# Patient Record
Sex: Female | Born: 1980 | Race: White | Hispanic: No | Smoking: Current every day smoker
Health system: Southern US, Community
[De-identification: ages and names within clinical notes are randomized; demographics above are authoritative.]

## PROBLEM LIST (undated history)

## (undated) DIAGNOSIS — R51 Headache: Secondary | ICD-10-CM

## (undated) DIAGNOSIS — R519 Headache, unspecified: Secondary | ICD-10-CM

## (undated) HISTORY — PX: NO PAST SURGERIES: SHX2092

---

## 2008-01-16 ENCOUNTER — Emergency Department (HOSPITAL_COMMUNITY): Admission: EM | Admit: 2008-01-16 | Discharge: 2008-01-16 | Payer: Self-pay | Admitting: Emergency Medicine

## 2010-01-04 IMAGING — CR DG ANKLE COMPLETE 3+V*L*
3 series · 3 of 3 positions shown · non-contrast
Comparison: None

CLINICAL DATA: Fell.  Pain.

LEFT ANKLE COMPLETE - 3+ VIEW

[t ankle joint ap left]
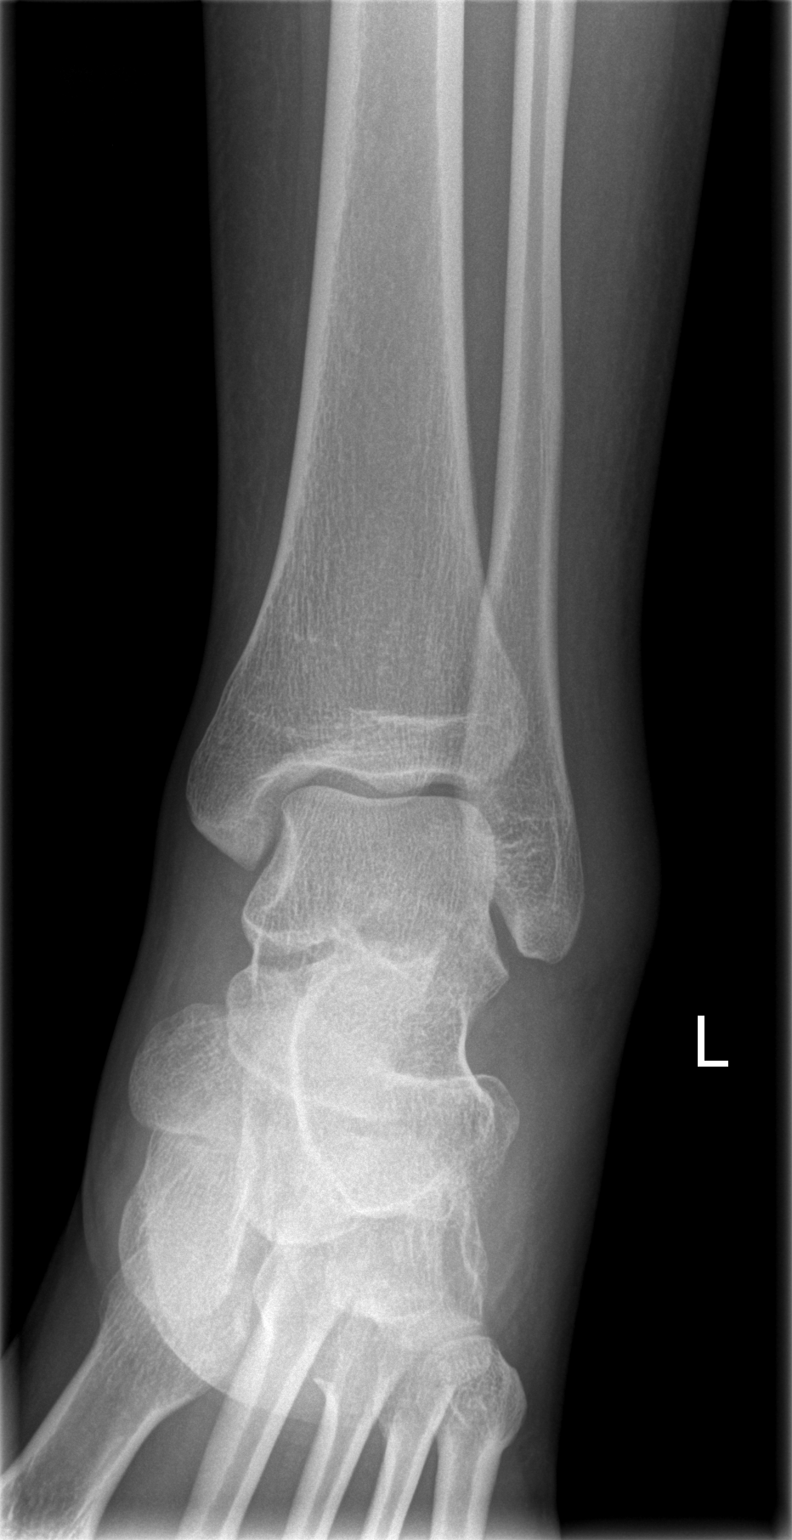

[t ankle joint oblique left]
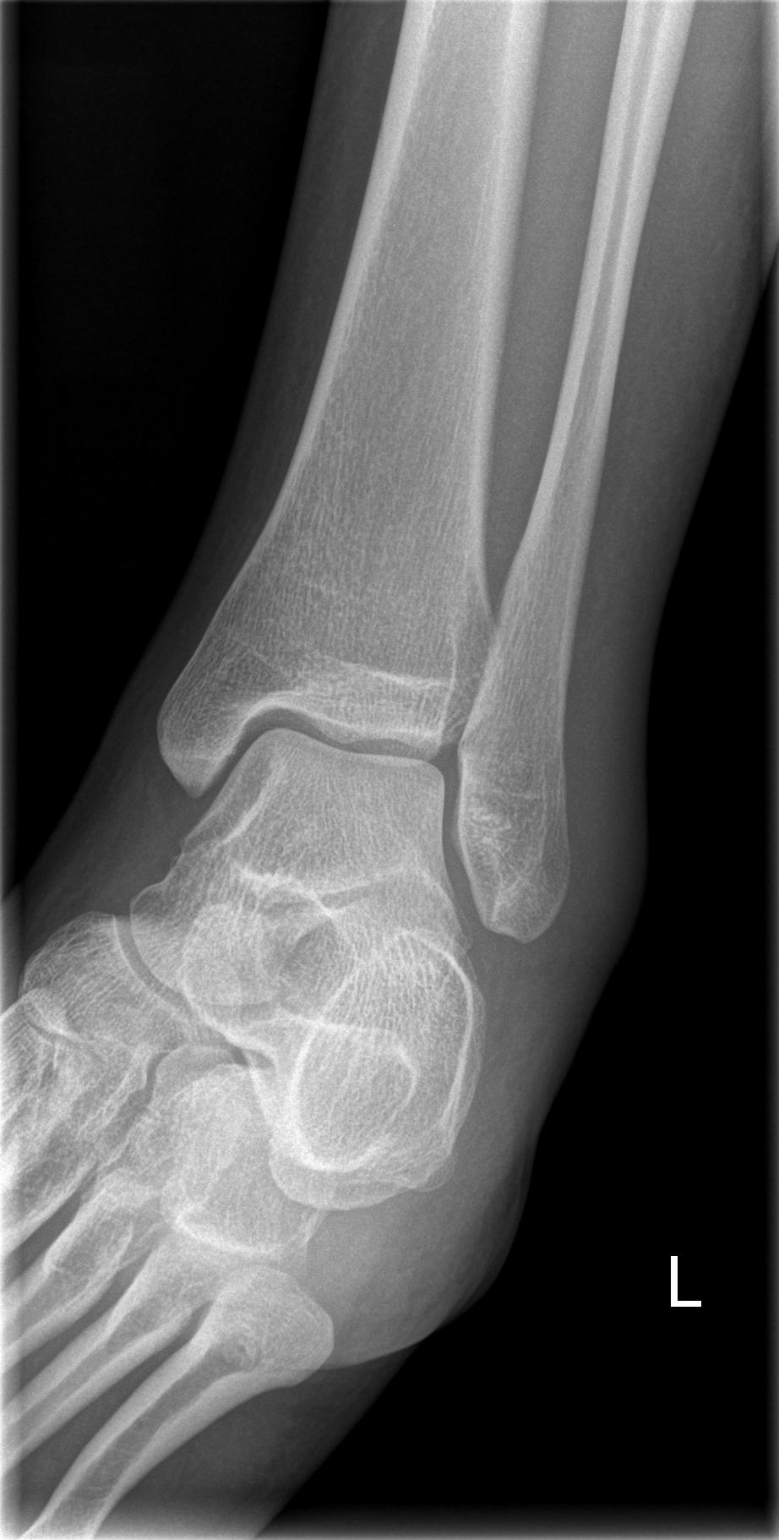

[t ankle joint lat left]
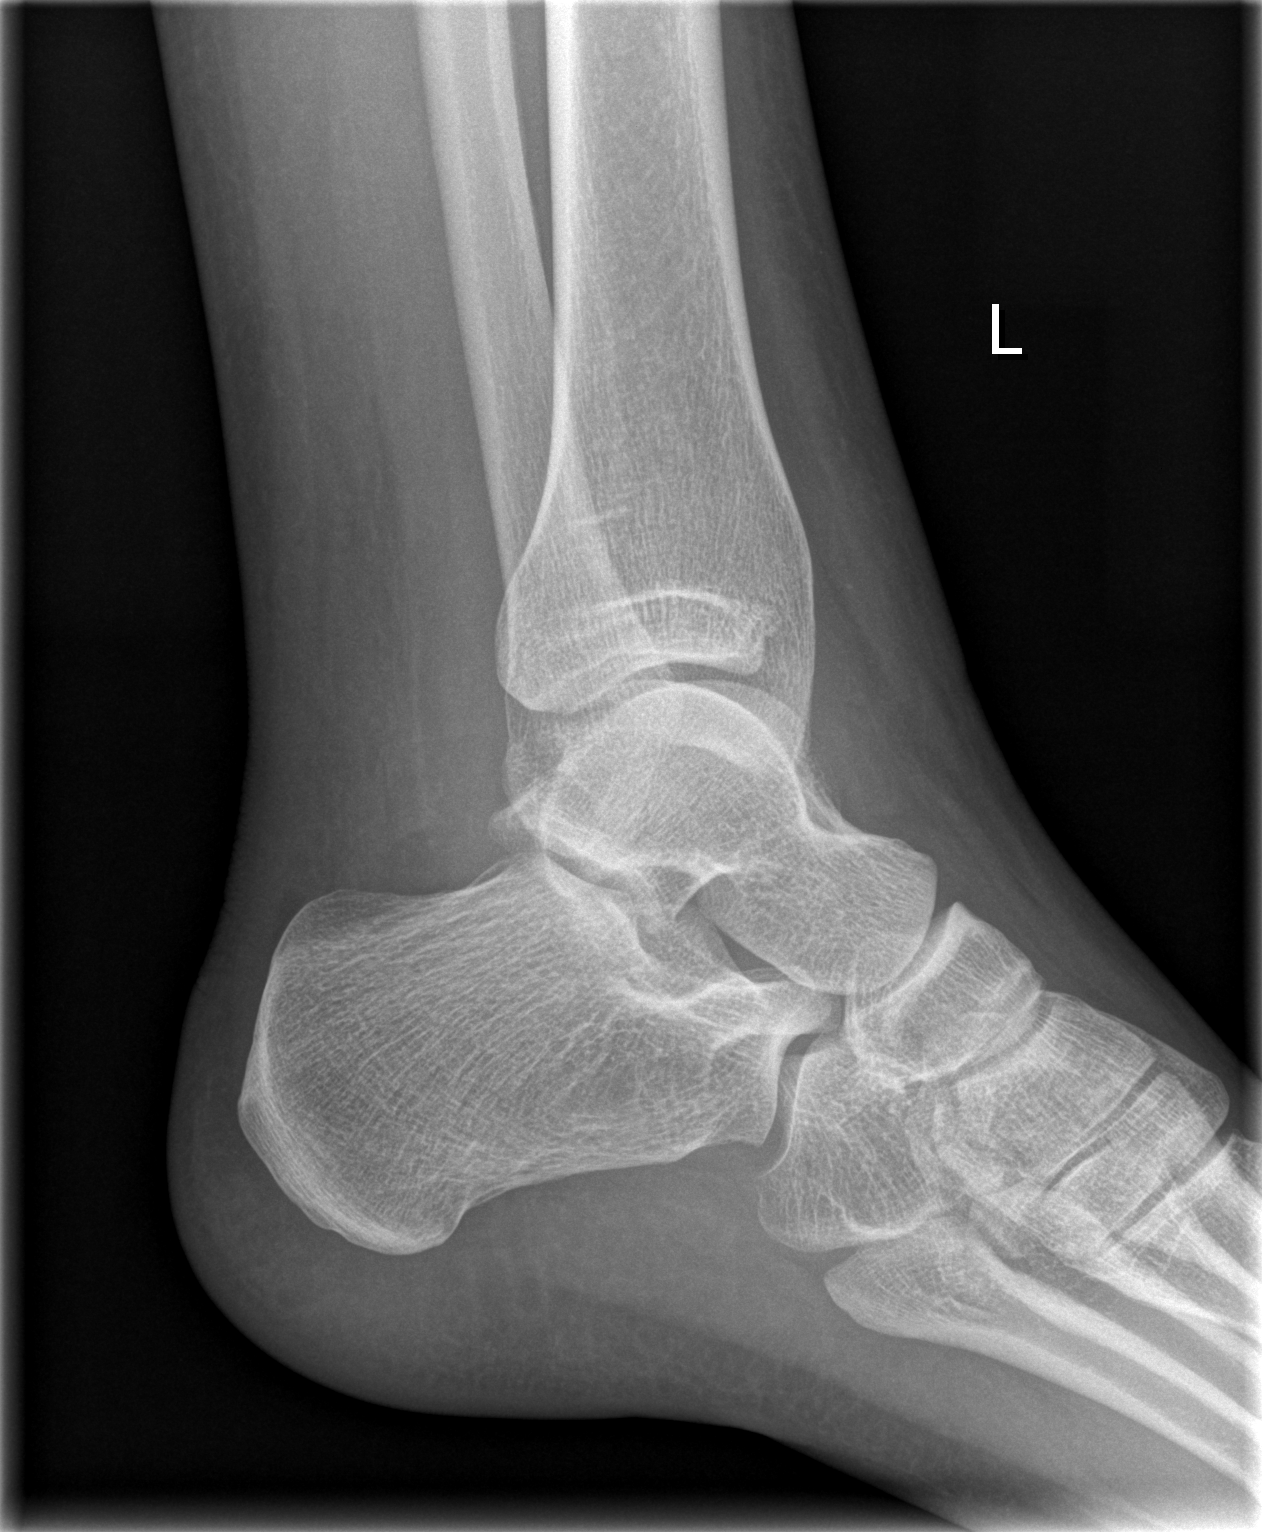

[3 of 3 positions shown; findings below may reference images not displayed]

FINDINGS: There is lateral soft tissue swelling.  There is a joint
effusion.  No definable fracture.
IMPRESSION: Lateral soft tissue swelling and joint effusion without fracture

## 2014-10-29 ENCOUNTER — Encounter (HOSPITAL_COMMUNITY): Payer: Self-pay | Admitting: *Deleted

## 2014-10-29 ENCOUNTER — Inpatient Hospital Stay (HOSPITAL_COMMUNITY)
Admission: AD | Admit: 2014-10-29 | Discharge: 2014-10-29 | Disposition: A | Discharge status: Home / Self Care | Payer: Self-pay | Source: Ambulatory Visit | Attending: Obstetrics and Gynecology | Admitting: Obstetrics and Gynecology

## 2014-10-29 DIAGNOSIS — F17210 Nicotine dependence, cigarettes, uncomplicated: Secondary | ICD-10-CM | POA: Insufficient documentation

## 2014-10-29 DIAGNOSIS — G43009 Migraine without aura, not intractable, without status migrainosus: Secondary | ICD-10-CM | POA: Insufficient documentation

## 2014-10-29 DIAGNOSIS — Z7982 Long term (current) use of aspirin: Secondary | ICD-10-CM | POA: Insufficient documentation

## 2014-10-29 HISTORY — DX: Headache: R51

## 2014-10-29 HISTORY — DX: Headache, unspecified: R51.9

## 2014-10-29 LAB — URINALYSIS, ROUTINE W REFLEX MICROSCOPIC
Bilirubin Urine: NEGATIVE
Glucose, UA: NEGATIVE mg/dL
Ketones, ur: NEGATIVE mg/dL
LEUKOCYTES UA: NEGATIVE
NITRITE: NEGATIVE
PROTEIN: NEGATIVE mg/dL
SPECIFIC GRAVITY, URINE: 1.02 (ref 1.005–1.030)
UROBILINOGEN UA: 0.2 mg/dL (ref 0.0–1.0)
pH: 6 (ref 5.0–8.0)

## 2014-10-29 LAB — URINE MICROSCOPIC-ADD ON

## 2014-10-29 LAB — POCT PREGNANCY, URINE: PREG TEST UR: NEGATIVE

## 2014-10-29 MED ORDER — BUTALBITAL-APAP-CAFFEINE 50-325-40 MG PO TABS: 1.0000 | ORAL_TABLET | Freq: Four times a day (QID) | ORAL | Status: AC | PRN

## 2014-10-29 MED ORDER — BUTALBITAL-APAP-CAFFEINE 50-325-40 MG PO TABS
2.0000 | ORAL_TABLET | Freq: Once | ORAL | Status: AC
  Administered 2014-10-29: 2 via ORAL
  Filled 2014-10-29: qty 2

## 2014-10-29 MED ORDER — IBUPROFEN 600 MG PO TABS: 600.0000 mg | ORAL_TABLET | Freq: Four times a day (QID) | ORAL | Status: AC | PRN

## 2014-10-29 MED ORDER — KETOROLAC TROMETHAMINE 60 MG/2ML IM SOLN
60.0000 mg | Freq: Once | INTRAMUSCULAR | Status: AC
  Administered 2014-10-29: 60 mg via INTRAMUSCULAR
  Filled 2014-10-29: qty 2

## 2014-10-29 NOTE — MAU Provider Note (Signed)
History     CSN: [id number]  Arrival date and time: 10/29/14 1609   First Provider Initiated Contact with Patient 10/29/14 1713      Chief Complaint  Patient presents with  . Migraine   HPI   [name] is a 34 y.o. female [id number] presenting to MAU with a migraine HA. She has a history of migraines and has failed treatment with several different medications. She is scheduled to see neurology at the end of the month. She is here in [place] visiting a family member.  The pain in her head started as just a regular HA. She tried goody's powder without relief. Today she started experiencing immense pressure with is consist ant with the pain she feels with a migraine; symptoms worsened today.    OB History    Gravida Para Term Preterm AB TAB SAB Ectopic Multiple Living        Past Medical History  Diagnosis Date  . Headache     Past Surgical History  Procedure Laterality Date  . No past surgeries      No family history on file.  Social History  Substance Use Topics  . Smoking status: Current Every Day Smoker  . Smokeless tobacco: None  . Alcohol Use: Yes     Comment: occasional 1-2 a month    Allergies: No Known Allergies  Prescriptions prior to admission  Medication Sig Dispense Refill Last Dose  . aspirin-acetaminophen-caffeine (EXCEDRIN MIGRAINE) 250-250-65 MG per tablet Take 2 tablets by mouth every 6 (six) hours as needed for headache.   Past Month at Unknown time  . Aspirin-Acetaminophen-Caffeine (GOODY HEADACHE PO) Take 1-2 packets by mouth 4 (four) times daily as needed (for headaches).   10/29/2014 at 1600   Results for orders placed or performed during the hospital encounter of 10/29/14 (from the past 48 hour(s))  Urinalysis, Routine w reflex microscopic (not at [facility])     Status: Abnormal   Collection Time: 10/29/14  4:25 PM  Result Value Ref Range   Color, Urine YELLOW YELLOW   APPearance CLOUDY (A) CLEAR   Specific Gravity,  Urine 1.020 1.005 - 1.030   pH 6.0 5.0 - 8.0   Glucose, UA NEGATIVE NEGATIVE mg/dL   Hgb urine dipstick MODERATE (A) NEGATIVE   Bilirubin Urine NEGATIVE NEGATIVE   Ketones, ur NEGATIVE NEGATIVE mg/dL   Protein, ur NEGATIVE NEGATIVE mg/dL   Urobilinogen, UA 0.2 0.0 - 1.0 mg/dL   Nitrite NEGATIVE NEGATIVE   Leukocytes, UA NEGATIVE NEGATIVE  Urine microscopic-add on     Status: Abnormal   Collection Time: 10/29/14  4:25 PM  Result Value Ref Range   Squamous Epithelial / LPF MANY (A) RARE   WBC, UA 0-2 <3 WBC/hpf   RBC / HPF 0-2 <3 RBC/hpf   Bacteria, UA MANY (A) RARE  Pregnancy, urine POC     Status: None   Collection Time: 10/29/14  4:29 PM  Result Value Ref Range   Preg Test, Ur NEGATIVE NEGATIVE    Comment:        THE SENSITIVITY OF THIS METHODOLOGY IS >24 mIU/mL     Review of Systems  Constitutional: Negative for fever and chills.  Eyes: Positive for blurred vision and photophobia.  Cardiovascular: Negative for chest pain.  Gastrointestinal: Positive for nausea. Negative for vomiting.  Neurological: Positive for headaches.   Physical Exam   Blood pressure 124/84, pulse 74, temperature 98.2 F (36.8 C), resp.  rate 18, last menstrual period 10/15/2014.  Physical Exam  Constitutional: She is oriented to person, place, and time. She appears well-developed and well-nourished. No distress.  HENT:  Head: Normocephalic.  Eyes: Pupils are equal, round, and reactive to light.  Neck: Neck supple.  Musculoskeletal: Normal range of motion.  Neurological: She is alert and oriented to person, place, and time. She exhibits normal muscle tone.  Skin: Skin is warm. She is not diaphoretic.  Psychiatric: Her behavior is normal.    MAU Course  Procedures  None  MDM  Fioricet 2 tabs Toradol 60 mg IM Patient rates her pain 0/10 at the time of discharge.   Assessment and Plan   A:  1. Migraine without aura and without status migrainosus, not intractable    P:  Discharge  home in stable condition RX: Fioricet, ibuprofen  Go to [facility] or [facility] if symptoms worsen Keep your appointment with Neurology as scheduled.   Duane Lope, NP 10/29/2014  5:18 PM

## 2014-10-29 NOTE — Discharge Instructions (Signed)

## 2014-10-29 NOTE — MAU Note (Signed)
Pt presents to MAU with complaints of a headache for three days, and a migraine for 5 hours.
